# Patient Record
Sex: Female | Born: 1965 | Race: White | Hispanic: No | Marital: Married | Smoking: Never smoker
Health system: Southern US, Community
[De-identification: ages and names within clinical notes are randomized; demographics above are authoritative.]

## PROBLEM LIST (undated history)

## (undated) HISTORY — PX: NO PAST SURGERIES: SHX2092

---

## 1994-05-14 HISTORY — PX: DILATION AND CURETTAGE OF UTERUS: SHX78

## 2020-03-07 ENCOUNTER — Other Ambulatory Visit: Payer: Self-pay

## 2020-03-07 ENCOUNTER — Encounter: Payer: Self-pay | Admitting: Emergency Medicine

## 2020-03-07 ENCOUNTER — Ambulatory Visit
Admission: EM | Admit: 2020-03-07 | Discharge: 2020-03-07 | Disposition: A | Discharge status: Home / Self Care | Payer: BC Managed Care – PPO | Attending: Emergency Medicine | Admitting: Emergency Medicine

## 2020-03-07 DIAGNOSIS — R059 Cough, unspecified: Secondary | ICD-10-CM

## 2020-03-07 DIAGNOSIS — Z20822 Contact with and (suspected) exposure to covid-19: Secondary | ICD-10-CM | POA: Insufficient documentation

## 2020-03-07 DIAGNOSIS — J0110 Acute frontal sinusitis, unspecified: Secondary | ICD-10-CM | POA: Diagnosis not present

## 2020-03-07 MED ORDER — DOXYCYCLINE HYCLATE 100 MG PO CAPS: 100.0000 mg | ORAL_CAPSULE | Freq: Two times a day (BID) | ORAL | 0 refills | Status: AC

## 2020-03-07 NOTE — ED Provider Notes (Signed)
[facility]    CSN: [id number] Arrival date & time: 03/07/20  1022      History   Chief Complaint Chief Complaint  Patient presents with  . Cough    HPI Alison Patel is a 54 y.o. female.   54 year old female presents with nasal congestion, headache, post nasal drainage and cough for the past 4 days. Getting worse over the past day with more sinus pressure and facial and tooth pain. Denies any fever or GI symptoms. Has taken Sudafed and Dayquil with minimal relief. No known exposure to COVID 19. Has had the vaccine. Has history of recurrent sinus infections- usually has about 2 to 3 a year. Symptoms feel similar to previous infections. Otherwise no other chronic health issues. Takes no daily medications.   The history is provided by the patient.    History reviewed. No pertinent past medical history.  There are no problems to display for this patient.   Past Surgical History:  Procedure Laterality Date  . NO PAST SURGERIES      OB History   No obstetric history on file.      Home Medications    Prior to Admission medications   Medication Sig Start Date End Date Taking? Authorizing Provider  doxycycline (VIBRAMYCIN) 100 MG capsule Take 1 capsule (100 mg total) by mouth 2 (two) times daily for 7 days. 03/07/20 03/14/20  Sudie Grumbling, NP    Family History History reviewed. No pertinent family history.  Social History Social History   Tobacco Use  . Smoking status: Never Smoker  . Smokeless tobacco: Never Used  Vaping Use  . Vaping Use: Never used  Substance Use Topics  . Alcohol use: Yes  . Drug use: Not Currently     Allergies   Penicillins   Review of Systems Review of Systems  Constitutional: Positive for fatigue. Negative for appetite change, chills and fever.  HENT: Positive for congestion, postnasal drip, sinus pressure and sinus pain. Negative for ear discharge, ear pain, facial swelling, mouth sores, nosebleeds, sore throat  and trouble swallowing.   Eyes: Negative for pain, discharge, redness and itching.  Respiratory: Positive for cough. Negative for chest tightness, shortness of breath and wheezing.   Gastrointestinal: Negative for abdominal pain, diarrhea, nausea and vomiting.  Musculoskeletal: Negative for arthralgias, myalgias, neck pain and neck stiffness.  Skin: Negative for color change and rash.  Allergic/Immunologic: Positive for environmental allergies. Negative for food allergies and immunocompromised state.  Neurological: Positive for headaches. Negative for dizziness, seizures, syncope, weakness, light-headedness and numbness.  Hematological: Negative for adenopathy. Does not bruise/bleed easily.     Physical Exam Triage Vital Signs ED Triage Vitals  Enc Vitals Group     BP 03/07/20 1046 116/69     Pulse Rate 03/07/20 1046 79     Resp 03/07/20 1046 18     Temp 03/07/20 1046 98.3 F (36.8 C)     Temp Source 03/07/20 1046 Oral     SpO2 03/07/20 1046 99 %     Weight 03/07/20 1042 130 lb (59 kg)     Height 03/07/20 1042 5\' 6"  (1.676 m)     Head Circumference --      Peak Flow --      Pain Score 03/07/20 1042 2     Pain Loc --      Pain Edu? --      Excl. in GC? --    No data found.  Updated Vital Signs BP  116/69 (BP Location: Left Arm)   Pulse 79   Temp 98.3 F (36.8 C) (Oral)   Resp 18   Ht 5\' 6"  (1.676 m)   Wt 130 lb (59 kg)   SpO2 99%   BMI 20.98 kg/m   Visual Acuity Right Eye Distance:   Left Eye Distance:   Bilateral Distance:    Right Eye Near:   Left Eye Near:    Bilateral Near:     Physical Exam Vitals and nursing note reviewed.  Constitutional:      General: She is awake. She is not in acute distress.    Appearance: She is well-developed and well-groomed. She is ill-appearing.     Comments: She is sitting comfortably on the exam table in no acute distress but appears ill and tired.   HENT:     Head: Normocephalic and atraumatic.     Right Ear: Hearing,  tympanic membrane, ear canal and external ear normal.     Left Ear: Hearing, tympanic membrane, ear canal and external ear normal.     Nose: Congestion present.     Right Sinus: Maxillary sinus tenderness and frontal sinus tenderness present.     Left Sinus: Maxillary sinus tenderness and frontal sinus tenderness present.     Comments: More frontal sinus tenderness than maxillary.     Mouth/Throat:     Lips: Pink.     Mouth: Mucous membranes are moist.     Pharynx: Uvula midline. Posterior oropharyngeal erythema present. No pharyngeal swelling or uvula swelling. Oropharyngeal exudate: slight yellowish post nasal drainage present.  Eyes:     Extraocular Movements: Extraocular movements intact.     Conjunctiva/sclera: Conjunctivae normal.  Cardiovascular:     Rate and Rhythm: Normal rate and regular rhythm.     Heart sounds: Normal heart sounds. No murmur heard.   Pulmonary:     Effort: Pulmonary effort is normal. No respiratory distress.     Breath sounds: Normal breath sounds and air entry. No decreased air movement. No decreased breath sounds, wheezing, rhonchi or rales.  Musculoskeletal:     Cervical back: Normal range of motion and neck supple. No rigidity.  Lymphadenopathy:     Cervical: No cervical adenopathy.  Skin:    General: Skin is warm and dry.     Capillary Refill: Capillary refill takes less than 2 seconds.     Findings: No rash.  Neurological:     General: No focal deficit present.     Mental Status: She is alert and oriented to person, place, and time.  Psychiatric:        Mood and Affect: Mood normal.        Behavior: Behavior normal. Behavior is cooperative.        Thought Content: Thought content normal.        Judgment: Judgment normal.      UC Treatments / Results  Labs (all labs ordered are listed, but only abnormal results are displayed) Labs Reviewed  SARS CORONAVIRUS 2 (TAT 6-24 HRS)    EKG   Radiology No results  found.  Procedures Procedures (including critical care time)  Medications Ordered in UC Medications - No data to display  Initial Impression / Assessment and Plan / UC Course  I have reviewed the triage vital signs and the nursing notes.  Pertinent labs & imaging results that were available during my care of the patient were reviewed by me and considered in my medical decision making (see chart for details).  Reviewed with patient that she probably has an early sinus infection. Due to previous history of recurrent infections, will treat for possible bacterial infection with Doxycycline 100mg  twice a day as directed. Continue to increase fluids to help loosen up mucus in sinuses. May continue Sudafed and Dayquil/Nyquil as needed for drainage that is probably causing her cough or may take Benadryl at night for drainage and to help sleep. Follow-up pending COVID 19 test results and in 4 to 5 days if not improving.  Final Clinical Impressions(s) / UC Diagnoses   Final diagnoses:  Acute non-recurrent frontal sinusitis  Cough     Discharge Instructions     Recommend start Doxycycline 100mg  twice a day as directed. Continue to increase fluids to help loosen up mucus in sinuses. May continue Sudafed and Dayquil/Nyquil or Benadryl as needed for drainage and congestion. Follow-up pending COVID 19 test results and in 4 to 5 days if not improving.     ED Prescriptions    Medication Sig Dispense Auth. Provider   doxycycline (VIBRAMYCIN) 100 MG capsule Take 1 capsule (100 mg total) by mouth 2 (two) times daily for 7 days. 14 capsule Nicholle Falzon, , NP     PDMP not reviewed this encounter.   , NP 03/07/20 1910

## 2020-03-07 NOTE — ED Triage Notes (Signed)
Pt c/o headache, sinus pain/pressure, cough, post nasal drip, green sputum. Started about 3 -4 days ago. Denies fever.

## 2020-03-07 NOTE — Discharge Instructions (Addendum)
Recommend start Doxycycline 100mg  twice a day as directed. Continue to increase fluids to help loosen up mucus in sinuses. May continue Sudafed and Dayquil/Nyquil or Benadryl as needed for drainage and congestion. Follow-up pending COVID 19 test results and in 4 to 5 days if not improving.

## 2020-03-08 LAB — SARS CORONAVIRUS 2 (TAT 6-24 HRS): SARS Coronavirus 2: NEGATIVE

## 2020-08-15 ENCOUNTER — Other Ambulatory Visit: Payer: Self-pay | Admitting: Obstetrics & Gynecology

## 2020-08-15 DIAGNOSIS — Z1231 Encounter for screening mammogram for malignant neoplasm of breast: Secondary | ICD-10-CM

## 2020-08-22 ENCOUNTER — Ambulatory Visit
Admission: RE | Admit: 2020-08-22 | Discharge: 2020-08-22 | Disposition: A | Discharge status: Home / Self Care | Payer: BC Managed Care – PPO | Source: Ambulatory Visit | Attending: Obstetrics & Gynecology | Admitting: Obstetrics & Gynecology

## 2020-08-22 ENCOUNTER — Other Ambulatory Visit: Payer: Self-pay

## 2020-08-22 DIAGNOSIS — Z1231 Encounter for screening mammogram for malignant neoplasm of breast: Secondary | ICD-10-CM | POA: Diagnosis present

## 2020-10-30 ENCOUNTER — Ambulatory Visit
Admission: EM | Admit: 2020-10-30 | Discharge: 2020-10-30 | Disposition: A | Discharge status: Home / Self Care | Payer: BC Managed Care – PPO | Attending: Emergency Medicine | Admitting: Emergency Medicine

## 2020-10-30 ENCOUNTER — Other Ambulatory Visit: Payer: Self-pay

## 2020-10-30 ENCOUNTER — Encounter: Payer: Self-pay | Admitting: Emergency Medicine

## 2020-10-30 DIAGNOSIS — J069 Acute upper respiratory infection, unspecified: Secondary | ICD-10-CM | POA: Diagnosis present

## 2020-10-30 DIAGNOSIS — U071 COVID-19: Secondary | ICD-10-CM

## 2020-10-30 MED ORDER — METHYLPREDNISOLONE 4 MG PO TBPK: ORAL_TABLET | ORAL | 0 refills | Status: DC

## 2020-10-30 MED ORDER — AZITHROMYCIN 250 MG PO TABS: ORAL_TABLET | ORAL | 0 refills | Status: DC

## 2020-10-30 MED ORDER — NIRMATRELVIR/RITONAVIR (PAXLOVID)TABLET: 3.0000 | ORAL_TABLET | Freq: Two times a day (BID) | ORAL | 0 refills | Status: AC

## 2020-10-30 MED ORDER — BENZONATATE 100 MG PO CAPS: 100.0000 mg | ORAL_CAPSULE | Freq: Three times a day (TID) | ORAL | 0 refills | Status: DC

## 2020-10-30 NOTE — ED Triage Notes (Addendum)
Patient c/o cough, congestion, sinus pressure, headache, and chills that started 2 days ago. Patient took a rapid covid test and was negative.  Patient states that her husband tested positive for COVID on Monday.  Patient did not want a rapid flu test today.

## 2020-10-30 NOTE — ED Provider Notes (Signed)
[facility]    CSN: [id number] Arrival date & time: 10/30/20  1027      History   Chief Complaint Chief Complaint  Patient presents with   Cough   Nasal Congestion    HPI Alison Patel is a 55 y.o. female who presents today for several day history of headache, chills, cough, sore throat and facial congestion.  The patient does report sinus drainage.  The patient denies any hearing loss or vision loss.  The patient's husband tested positive for COVID last week and she has been around him.  The patient denies any significant production with her cough, when she does produce sputum she states is a yellow color.  She denies any chest pain or chest tightness.  Denies any loss of taste or smell.  The patient has been taking Tylenol as needed for comfort at this time.  The patient is currently febrile at today's visit.   Cough Associated symptoms: no chest pain    History reviewed. No pertinent past medical history.  There are no problems to display for this patient.   Past Surgical History:  Procedure Laterality Date   NO PAST SURGERIES      OB History   No obstetric history on file.      Home Medications    Prior to Admission medications   Medication Sig Start Date End Date Taking? Authorizing Provider  azithromycin (ZITHROMAX Z-PAK) 250 MG tablet Take per package instructions 10/30/20  Yes Anson Oregon, PA-C  benzonatate (TESSALON) 100 MG capsule Take 1 capsule (100 mg total) by mouth every 8 (eight) hours. 10/30/20  Yes Anson Oregon, PA-C  methylPREDNISolone (MEDROL DOSEPAK) 4 MG TBPK tablet Take per package instructions. 10/30/20  Yes Anson Oregon, PA-C  nirmatrelvir/ritonavir EUA (PAXLOVID) TABS Take 3 tablets by mouth 2 (two) times daily for 5 days. Patient GFR is 87. Take nirmatrelvir (150 mg) two tablets twice daily for 5 days and ritonavir (100 mg) one tablet twice daily for 5 days. 10/30/20 11/04/20 Yes Anson Oregon, PA-C     Family History Family History  Problem Relation Age of Onset   Breast cancer Mother 73   Breast cancer Maternal Aunt 55    Social History Social History   Tobacco Use   Smoking status: Never   Smokeless tobacco: Never  Vaping Use   Vaping Use: Never used  Substance Use Topics   Alcohol use: Yes   Drug use: Not Currently     Allergies   Penicillins   Review of Systems Review of Systems  HENT:  Positive for congestion, postnasal drip, sinus pressure and sinus pain.   Respiratory:  Positive for cough.   Cardiovascular:  Negative for chest pain.  Gastrointestinal:  Negative for nausea and vomiting.  All other systems reviewed and are negative.   Physical Exam Triage Vital Signs ED Triage Vitals  Enc Vitals Group     BP 10/30/20 1138 121/78     Pulse Rate 10/30/20 1138 (!) 113     Resp 10/30/20 1138 14     Temp 10/30/20 1138 (!) 100.5 F (38.1 C)     Temp Source 10/30/20 1138 Oral     SpO2 10/30/20 1138 99 %     Weight 10/30/20 1134 134 lb (60.8 kg)     Height 10/30/20 1134 5\' 6"  (1.676 m)     Head Circumference --      Peak Flow --      Pain Score 10/30/20 1134  8     Pain Loc --      Pain Edu? --      Excl. in GC? --    No data found.  Updated Vital Signs BP 121/78 (BP Location: Left Arm)   Pulse (!) 113   Temp (!) 100.5 F (38.1 C) (Oral)   Resp 14   Ht 5\' 6"  (1.676 m)   Wt 134 lb (60.8 kg)   SpO2 99%   BMI 21.63 kg/m   Visual Acuity Right Eye Distance:   Left Eye Distance:   Bilateral Distance:    Right Eye Near:   Left Eye Near:    Bilateral Near:     Physical Exam Constitutional:      Appearance: She is not ill-appearing, toxic-appearing or diaphoretic.  HENT:     Right Ear: Tympanic membrane and ear canal normal.     Left Ear: Tympanic membrane and ear canal normal.     Nose:     Comments: Mild erythema to bilateral nasal canals.    Mouth/Throat:     Mouth: Mucous membranes are dry.     Pharynx: Posterior oropharyngeal  erythema present. No oropharyngeal exudate.  Cardiovascular:     Rate and Rhythm: Regular rhythm. Tachycardia present.     Heart sounds: No murmur heard.   No gallop.  Pulmonary:     Breath sounds: Normal breath sounds. No stridor. No wheezing or rhonchi.  Abdominal:     General: Bowel sounds are normal.     Palpations: Abdomen is soft.  Lymphadenopathy:     Cervical: No cervical adenopathy.  Neurological:     Mental Status: She is alert.   UC Treatments / Results  Labs (all labs ordered are listed, but only abnormal results are displayed) Labs Reviewed  SARS CORONAVIRUS 2 (TAT 6-24 HRS)    EKG   Radiology No results found.  Procedures Procedures (including critical care time)  Medications Ordered in UC Medications - No data to display  Initial Impression / Assessment and Plan / UC Course  I have reviewed the triage vital signs and the nursing notes.  Pertinent labs & imaging results that were available during my care of the patient were reviewed by me and considered in my medical decision making (see chart for details).     1.  Treatment options were discussed today with the patient. 2.  The patient has positive COVID exposure and is currently febrile at today's visit, COVID test ordered, I do believe the patient will be positive for COVID. 3.  The patient was prescribed Paxlovid, she was instructed to not fill this until the positive COVID test is known. 4.  The patient was also given a prescription for azithromycin in addition to Medrol and Tessalon Perles, if she is negative for COVID I do believe that the patient is developing a bacterial sinusitis versus bronchitis. 5.  Remain in quarantine until COVID test results are known. 6.  Follow-up if symptoms fail to improve. Final Clinical Impressions(s) / UC Diagnoses   Final diagnoses:  Upper respiratory tract infection, unspecified type  COVID     Discharge Instructions      -Remain in isolation until COVID  results are known. -If COVID test is positive would recommend filling the Paxlovid prescription which is the antiviral medication. -If COVID test is negative, fill Zithromax and begin taking as prescribed. -Can go ahead and fill the prednisone to help with cough symptoms and Tessalon for cough symptoms as well. -Follow-up if  no improvement of symptoms.     ED Prescriptions     Medication Sig Dispense Auth. Provider   nirmatrelvir/ritonavir EUA (PAXLOVID) TABS Take 3 tablets by mouth 2 (two) times daily for 5 days. Patient GFR is 87. Take nirmatrelvir (150 mg) two tablets twice daily for 5 days and ritonavir (100 mg) one tablet twice daily for 5 days. 30 tablet Anson Oregon, PA-C   benzonatate (TESSALON) 100 MG capsule Take 1 capsule (100 mg total) by mouth every 8 (eight) hours. 21 capsule Anson Oregon, PA-C   methylPREDNISolone (MEDROL DOSEPAK) 4 MG TBPK tablet Take per package instructions. 21 tablet Anson Oregon, PA-C   azithromycin (ZITHROMAX Z-PAK) 250 MG tablet Take per package instructions 6 tablet Anson Oregon, PA-C      PDMP not reviewed this encounter.   Anson Oregon, PA-C 10/30/20 1740

## 2020-10-30 NOTE — Discharge Instructions (Addendum)
-  Remain in isolation until COVID results are known. -If COVID test is positive would recommend filling the Paxlovid prescription which is the antiviral medication. -If COVID test is negative, fill Zithromax and begin taking as prescribed. -Can go ahead and fill the prednisone to help with cough symptoms and Tessalon for cough symptoms as well. -Follow-up if no improvement of symptoms.

## 2020-10-31 LAB — SARS CORONAVIRUS 2 (TAT 6-24 HRS): SARS Coronavirus 2: POSITIVE — AB

## 2020-11-15 ENCOUNTER — Other Ambulatory Visit: Payer: Self-pay

## 2020-11-15 ENCOUNTER — Ambulatory Visit
Admission: EM | Admit: 2020-11-15 | Discharge: 2020-11-15 | Disposition: A | Discharge status: Home / Self Care | Payer: BC Managed Care – PPO | Attending: Family Medicine | Admitting: Family Medicine

## 2020-11-15 DIAGNOSIS — N3000 Acute cystitis without hematuria: Secondary | ICD-10-CM | POA: Insufficient documentation

## 2020-11-15 LAB — URINALYSIS, COMPLETE (UACMP) WITH MICROSCOPIC
Bilirubin Urine: NEGATIVE
Glucose, UA: NEGATIVE mg/dL
Ketones, ur: NEGATIVE mg/dL
Nitrite: POSITIVE — AB
Specific Gravity, Urine: 1.015 (ref 1.005–1.030)
WBC, UA: >50 WBC/hpf (ref 0–5)
pH: 7 (ref 5.0–8.0)

## 2020-11-15 MED ORDER — CEPHALEXIN 500 MG PO CAPS: 500.0000 mg | ORAL_CAPSULE | Freq: Two times a day (BID) | ORAL | 0 refills | Status: DC

## 2020-11-15 NOTE — ED Triage Notes (Signed)
Pt c/o cloudy urine, urinary urgency since Saturday.

## 2020-11-15 NOTE — ED Provider Notes (Signed)
[facility]    CSN: [id number] Arrival date & time: 11/15/20  0850      History   Chief Complaint Chief Complaint  Patient presents with   Urinary Frequency   HPI  55 year old female presents with urinary symptoms.  Started Saturday.  She reports cloudy urine, urinary frequency and urgency.  Denies hematuria.  Denies abdominal pain.  She has had some low back pain.  Unsure if this is contributing.  She states that she has some mild dysuria.  No relieving factors.  No other associated symptoms.  No other complaints.  Home Medications    Prior to Admission medications   Medication Sig Start Date End Date Taking? Authorizing Provider  cephALEXin (KEFLEX) 500 MG capsule Take 1 capsule (500 mg total) by mouth 2 (two) times daily. 11/15/20  Yes Nuvia Hileman, Verdis Frederickson, DO  azithromycin (ZITHROMAX Z-PAK) 250 MG tablet Take per package instructions 10/30/20   Anson Oregon, PA-C  benzonatate (TESSALON) 100 MG capsule Take 1 capsule (100 mg total) by mouth every 8 (eight) hours. 10/30/20   Anson Oregon, PA-C  methylPREDNISolone (MEDROL DOSEPAK) 4 MG TBPK tablet Take per package instructions. 10/30/20   Anson Oregon, PA-C    Family History Family History  Problem Relation Age of Onset   Breast cancer Mother 10   Breast cancer Maternal Aunt 42    Social History Social History   Tobacco Use   Smoking status: Never   Smokeless tobacco: Never  Vaping Use   Vaping Use: Never used  Substance Use Topics   Alcohol use: Yes   Drug use: Not Currently     Allergies   Penicillins   Review of Systems Review of Systems  Constitutional: Negative.   Gastrointestinal: Negative.   Genitourinary:  Positive for dysuria, frequency and urgency.    Physical Exam Triage Vital Signs ED Triage Vitals  Enc Vitals Group     BP 11/15/20 0923 111/63     Pulse Rate 11/15/20 0923 99     Resp 11/15/20 0923 16     Temp 11/15/20 0923 98.5 F (36.9 C)     Temp Source  11/15/20 0923 Oral     SpO2 11/15/20 0923 100 %     Weight 11/15/20 0922 134 lb (60.8 kg)     Height --      Head Circumference --      Peak Flow --      Pain Score 11/15/20 0922 0     Pain Loc --      Pain Edu? --      Excl. in GC? --    Updated Vital Signs BP 111/63 (BP Location: Left Arm)   Pulse 99   Temp 98.5 F (36.9 C) (Oral)   Resp 16   Wt 60.8 kg   SpO2 100%   BMI 21.63 kg/m   Visual Acuity Right Eye Distance:   Left Eye Distance:   Bilateral Distance:    Right Eye Near:   Left Eye Near:    Bilateral Near:     Physical Exam Vitals and nursing note reviewed.  Constitutional:      General: She is not in acute distress.    Appearance: Normal appearance. She is not ill-appearing.  HENT:     Head: Normocephalic and atraumatic.  Eyes:     General:        Right eye: No discharge.        Left eye: No discharge.  Conjunctiva/sclera: Conjunctivae normal.  Pulmonary:     Effort: Pulmonary effort is normal.     Breath sounds: Normal breath sounds. No wheezing, rhonchi or rales.  Abdominal:     General: There is no distension.     Palpations: Abdomen is soft.     Tenderness: There is no abdominal tenderness. There is no right CVA tenderness or left CVA tenderness.  Neurological:     Mental Status: She is alert.  Psychiatric:        Mood and Affect: Mood normal.        Behavior: Behavior normal.     UC Treatments / Results  Labs (all labs ordered are listed, but only abnormal results are displayed) Labs Reviewed  URINALYSIS, COMPLETE (UACMP) WITH MICROSCOPIC - Abnormal; Notable for the following components:      Result Value   Color, Urine STRAW (*)    APPearance CLOUDY (*)    Hgb urine dipstick SMALL (*)    Protein, ur TRACE (*)    Nitrite POSITIVE (*)    Leukocytes,Ua LARGE (*)    Bacteria, UA MANY (*)    All other components within normal limits  URINE CULTURE    EKG   Radiology No results found.  Procedures Procedures (including  critical care time)  Medications Ordered in UC Medications - No data to display  Initial Impression / Assessment and Plan / UC Course  I have reviewed the triage vital signs and the nursing notes.  Pertinent labs & imaging results that were available during my care of the patient were reviewed by me and considered in my medical decision making (see chart for details).    55 year old female presents with UTI.  Treating with Keflex. Awaiting culture results.  Final Clinical Impressions(s) / UC Diagnoses   Final diagnoses:  Acute cystitis without hematuria   Discharge Instructions   None    ED Prescriptions     Medication Sig Dispense Auth. Provider   cephALEXin (KEFLEX) 500 MG capsule Take 1 capsule (500 mg total) by mouth 2 (two) times daily. 14 capsule Everlene Other G, DO      PDMP not reviewed this encounter.   Tommie Sams, DO 11/15/20 1030

## 2020-11-18 LAB — URINE CULTURE: Culture: >=100000 — AB

## 2021-08-16 ENCOUNTER — Other Ambulatory Visit: Payer: Self-pay | Admitting: Certified Nurse Midwife

## 2021-08-16 DIAGNOSIS — Z1231 Encounter for screening mammogram for malignant neoplasm of breast: Secondary | ICD-10-CM

## 2021-08-31 ENCOUNTER — Ambulatory Visit
Admission: RE | Admit: 2021-08-31 | Discharge: 2021-08-31 | Disposition: A | Discharge status: Home / Self Care | Payer: BC Managed Care – PPO | Source: Ambulatory Visit | Attending: Certified Nurse Midwife | Admitting: Certified Nurse Midwife

## 2021-08-31 DIAGNOSIS — Z1231 Encounter for screening mammogram for malignant neoplasm of breast: Secondary | ICD-10-CM | POA: Diagnosis present

## 2021-10-06 ENCOUNTER — Ambulatory Visit
Admission: EM | Admit: 2021-10-06 | Discharge: 2021-10-06 | Disposition: A | Discharge status: Home / Self Care | Payer: BC Managed Care – PPO

## 2021-10-06 DIAGNOSIS — L509 Urticaria, unspecified: Secondary | ICD-10-CM

## 2021-10-06 NOTE — ED Triage Notes (Signed)
Pt c/o rash along thighs, feet, and knees x3days.  Pt states that the rash comes and goes and has changed locations over the last few days.  Pt states that her neighbor had [place] spotted fever and states that she had ticks on her recently.   Pt states that the rash itches.

## 2021-10-06 NOTE — Discharge Instructions (Addendum)
-  The rash that you have that comes and goes appears to look like hives.  This could be due to some sort of allergy or in contact with or as we discussed, hives could have been due to other things including stress, heat, cold, exercise or for seemingly no reason at all. - Reach out to your PCP about getting another opinion from an allergist and potentially more allergy testing. - Until then, continue antihistamines as needed. - Go to ER for any severe acute worsening of symptoms, especially if you were to ever have chest tightness or breathing difficulty or oral or facial swelling.

## 2021-10-06 NOTE — ED Provider Notes (Signed)
[facility]    CSN: [id number] Arrival date & time: 10/06/21  1522      History   Chief Complaint Chief Complaint  Patient presents with   Rash    HPI Kanyia Heaslip is a 56 y.o. female presenting for intermittent hives like rashes for the past 3 days.  She says that they will appear on her thighs, feet and knees.  She experienced some on her feet earlier today but says they have gone away now.  She did not take any medicine when she noticed them earlier.  Sometimes, she says she takes Benadryl and the rash goes away.  She takes Benadryl every night for chronic cough and postnasal drainage that is due to allergies.  Patient has had allergy testing (skin testing) and also seen a pulmonologist about chronic cough and postnasal drainage but did not have any positive testing.  However, the pulmonologist believes that she has allergy to some undiagnosed cause.  Patient reports she had been taking daily antihistamine in the morning also for the past week but stopped taking that a few days ago.  She does not currently have a rash.  She denies any associated swelling or breathing difficulty.  Patient has allergy to penicillin, otherwise no known allergies.  No new medications or different lotions, perfumes, body washes or detergents.  HPI  History reviewed. No pertinent past medical history.  There are no problems to display for this patient.   Past Surgical History:  Procedure Laterality Date   NO PAST SURGERIES      OB History   No obstetric history on file.      Home Medications    Prior to Admission medications   Not on File    Family History Family History  Problem Relation Age of Onset   Breast cancer Mother 76   Breast cancer Maternal Aunt 32    Social History Social History   Tobacco Use   Smoking status: Never   Smokeless tobacco: Never  Vaping Use   Vaping Use: Never used  Substance Use Topics   Alcohol use: Yes   Drug use: Not Currently      Allergies   Penicillins   Review of Systems Review of Systems  Constitutional:  Negative for fatigue and fever.  HENT:  Positive for postnasal drip (chronic). Negative for congestion, facial swelling and sore throat.   Respiratory:  Positive for cough (chronic). Negative for shortness of breath and wheezing.   Gastrointestinal:  Negative for vomiting.  Musculoskeletal:  Negative for arthralgias and joint swelling.  Skin:  Positive for rash.  Allergic/Immunologic: Negative for environmental allergies and food allergies.    Physical Exam Triage Vital Signs ED Triage Vitals  Enc Vitals Group     BP 10/06/21 1607 117/68     Pulse Rate 10/06/21 1607 85     Resp 10/06/21 1607 18     Temp 10/06/21 1607 98.3 F (36.8 C)     Temp Source 10/06/21 1607 Oral     SpO2 10/06/21 1607 97 %     Weight 10/06/21 1606 127 lb (57.6 kg)     Height 10/06/21 1606 5\' 6"  (1.676 m)     Head Circumference --      Peak Flow --      Pain Score 10/06/21 1605 0     Pain Loc --      Pain Edu? --      Excl. in GC? --    No data found.  Updated Vital Signs BP 117/68 (BP Location: Left Arm)   Pulse 85   Temp 98.3 F (36.8 C) (Oral)   Resp 18   Ht 5\' 6"  (1.676 m)   Wt 127 lb (57.6 kg)   SpO2 97%   BMI 20.50 kg/m      Physical Exam Vitals and nursing note reviewed.  Constitutional:      General: She is not in acute distress.    Appearance: Normal appearance. She is not ill-appearing or toxic-appearing.  HENT:     Head: Normocephalic and atraumatic.     Nose: Nose normal.     Mouth/Throat:     Mouth: Mucous membranes are moist.     Pharynx: Oropharynx is clear.  Eyes:     General: No scleral icterus.       Right eye: No discharge.        Left eye: No discharge.     Conjunctiva/sclera: Conjunctivae normal.  Cardiovascular:     Rate and Rhythm: Normal rate and regular rhythm.     Heart sounds: Normal heart sounds.  Pulmonary:     Effort: Pulmonary effort is normal. No respiratory  distress.     Breath sounds: Normal breath sounds.  Musculoskeletal:     Cervical back: Neck supple.  Skin:    General: Skin is dry.     Findings: No rash.  Neurological:     General: No focal deficit present.     Mental Status: She is alert. Mental status is at baseline.     Motor: No weakness.     Gait: Gait normal.  Psychiatric:        Mood and Affect: Mood normal.        Behavior: Behavior normal.        Thought Content: Thought content normal.     UC Treatments / Results  Labs (all labs ordered are listed, but only abnormal results are displayed) Labs Reviewed - No data to display  EKG   Radiology No results found.  Procedures Procedures (including critical care time)  Medications Ordered in UC Medications - No data to display  Initial Impression / Assessment and Plan / UC Course  I have reviewed the triage vital signs and the nursing notes.  Pertinent labs & imaging results that were available during my care of the patient were reviewed by me and considered in my medical decision making (see chart for details).  56 year old female presents for hives like rash that comes and goes over the past 3 days.  She did currently does not have a rash but she did show me a picture of hives like rash on her feet that she experienced earlier today.  Rash goes away with Benadryl or sometimes randomly on its own.  Patient reports being under a little bit more stress than normal since moving her [place] to [place].  She denies any associated breathing difficulty or facial swelling.  She does have chronic cough and postnasal drainage related to suspected allergy but has not been positive on any allergy test.  Vitals are stable and normal today.  She is overall well-appearing.  She currently does not have a rash and her exam is otherwise normal.  Advised patient her rash appears to be hives like and it could be completely random or due to heat, cold, stress, undiagnosed allergy.  Suggest  that she continue the antihistamines and try to identify potential trigger, otherwise follow-up with PCP and ask about getting allergy testing again since  its been several years since she was tested.  ED precautions reviewed.   Final Clinical Impressions(s) / UC Diagnoses   Final diagnoses:  Hives     Discharge Instructions      -The rash that you have that comes and goes appears to look like hives.  This could be due to some sort of allergy or in contact with or as we discussed, hives could have been due to other things including stress, heat, cold, exercise or for seemingly no reason at all. - Reach out to your PCP about getting another opinion from an allergist and potentially more allergy testing. - Until then, continue antihistamines as needed. - Go to ER for any severe acute worsening of symptoms, especially if you were to ever have chest tightness or breathing difficulty or oral or facial swelling.     ED Prescriptions   None    PDMP not reviewed this encounter.   Shirlee Latch, PA-C 10/06/21 1645

## 2021-11-11 ENCOUNTER — Ambulatory Visit
Admission: EM | Admit: 2021-11-11 | Discharge: 2021-11-11 | Disposition: A | Discharge status: Home / Self Care | Payer: BC Managed Care – PPO | Attending: Family Medicine | Admitting: Family Medicine

## 2021-11-11 ENCOUNTER — Encounter: Payer: Self-pay | Admitting: Emergency Medicine

## 2021-11-11 DIAGNOSIS — S50312A Abrasion of left elbow, initial encounter: Secondary | ICD-10-CM | POA: Diagnosis not present

## 2021-11-11 DIAGNOSIS — Z23 Encounter for immunization: Secondary | ICD-10-CM

## 2021-11-11 DIAGNOSIS — T148XXA Other injury of unspecified body region, initial encounter: Secondary | ICD-10-CM

## 2021-11-11 MED ORDER — TETANUS-DIPHTH-ACELL PERTUSSIS 5-2.5-18.5 LF-MCG/0.5 IM SUSY
0.5000 mL | PREFILLED_SYRINGE | Freq: Once | INTRAMUSCULAR | Status: AC
  Administered 2021-11-11: 0.5 mL via INTRAMUSCULAR

## 2021-11-11 NOTE — ED Triage Notes (Signed)
Patient states that she scratched her left upper arm on a possum cage about 1 hour ago.  Patient denies an animal bite.  Patient has no bleeding at this time.

## 2021-11-11 NOTE — ED Provider Notes (Signed)
[facility]    CSN: [id number] Arrival date & time: 11/11/21  1241      History   Chief Complaint Chief Complaint  Patient presents with   Abrasion    Left upper arm    HPI 56 year old female presents with an abrasion to her left elbow.  She states that she scratched her left arm on a possum cage approximately hour prior to arrival.  The area was initially bleeding but this has now stopped.  The area is very small.  No bite per her report.  Unsure last tetanus.  Needs tetanus today.  Denies any pain.  No other associated symptoms.  No other complaints.   Home Medications    Prior to Admission medications   Medication Sig Start Date End Date Taking? Authorizing Provider  fexofenadine (ALLEGRA) 60 MG tablet Take by mouth.    [provider]  levocetirizine (XYZAL) 5 MG tablet Take by mouth.    [provider]    Family History Family History  Problem Relation Age of Onset   Breast cancer Mother 60   Breast cancer Maternal Aunt 47    Social History Social History   Tobacco Use   Smoking status: Never   Smokeless tobacco: Never  Vaping Use   Vaping Use: Never used  Substance Use Topics   Alcohol use: Yes   Drug use: Not Currently     Allergies   Penicillins   Review of Systems Review of Systems Per HPI  Physical Exam Triage Vital Signs ED Triage Vitals  Enc Vitals Group     BP 11/11/21 1304 (!) 114/55     Pulse Rate 11/11/21 1304 73     Resp 11/11/21 1304 14     Temp 11/11/21 1304 98.2 F (36.8 C)     Temp Source 11/11/21 1304 Oral     SpO2 11/11/21 1304 96 %     Weight 11/11/21 1301 128 lb (58.1 kg)     Height 11/11/21 1301 5\' 6"  (1.676 m)     Head Circumference --      Peak Flow --      Pain Score 11/11/21 1301 0     Pain Loc --      Pain Edu? --      Excl. in GC? --    Updated Vital Signs BP (!) 114/55 (BP Location: Left Arm)   Pulse 73   Temp 98.2 F (36.8 C) (Oral)   Resp 14   Ht 5\' 6"  (1.676 m)   Wt  58.1 kg   SpO2 96%   BMI 20.66 kg/m   Visual Acuity Right Eye Distance:   Left Eye Distance:   Bilateral Distance:    Right Eye Near:   Left Eye Near:    Bilateral Near:     Physical Exam Constitutional:      General: She is not in acute distress.    Appearance: Normal appearance.  HENT:     Head: Normocephalic and atraumatic.  Pulmonary:     Effort: Pulmonary effort is normal. No respiratory distress.  Skin:    Comments: Small abrasion to the left elbow.  Neurological:     Mental Status: She is alert.  Psychiatric:        Mood and Affect: Mood normal.        Behavior: Behavior normal.    UC Treatments / Results  Labs (all labs ordered are listed, but only abnormal results are displayed) Labs Reviewed - No data  to display  EKG   Radiology No results found.  Procedures Procedures (including critical care time)  Medications Ordered in UC Medications  Tdap (BOOSTRIX) injection 0.5 mL (0.5 mLs Intramuscular Given 11/11/21 1306)    Initial Impression / Assessment and Plan / UC Course  I have reviewed the triage vital signs and the nursing notes.  Pertinent labs & imaging results that were available during my care of the patient were reviewed by me and considered in my medical decision making (see chart for details).    56 year old female presents with an abrasion.  Advised to keep the area clean.  Supportive care.  Tetanus given today.  Final Clinical Impressions(s) / UC Diagnoses   Final diagnoses:  Abrasion   Discharge Instructions   None    ED Prescriptions   None    PDMP not reviewed this encounter.   Tommie Sams, DO 11/11/21 1316

## 2022-01-19 LAB — LAB REPORT - SCANNED: EGFR: 103

## 2022-08-13 NOTE — Progress Notes (Signed)
Office Visit Note  Patient: Alison Patel             Date of Birth: 1965-11-10           MRN: [id number]             PCP: Patient, No Pcp Per Referring: Ronne Binning, NP Visit Date: 08/24/2022 Occupation: @  Subjective:  Chronic cough, hives and positive ANA  History of Present Illness: Alison Patel is a 57 y.o. female seen in consultation per request of her PCP for the evaluation of positive ANA.  According the patient while she was living in [place] about 20 years ago she started having chronic cough.  She was evaluated by pulmonologist who felt that her symptoms were related to allergies.  She was also evaluated by an ENT doctor who thought her symptoms were related to reflux.  She had a chest x-ray and a CT scan.  Patient recalls that she was told there was some scarring in there but no further workup was needed.  She states her symptoms persist but no further interventions were done.  She moved from [place] to [place] in 2021.  She states she went for a hike in the fall to [place] and developed hives all over her body.  She noticed that after hiking and exercise she will develop hives.  She was evaluated by Dr. Cheree Ditto, dermatologist who advised antihistamines.  She started taking Allegra in the morning and Xyzal in the evening.  She states her hives got worse when she moved her daughter in May 2023 from [place] to [place].  She went to see her PCP in August 2023 who did labs and her ANA was positive but no titer was given.  She has noticed hives after playing tennis.  She was evaluated by dermatologist again and had additional additional labs which were negative.  She also had a recent chest x-ray last year which was within normal limits.  She denies any history of oral ulcers, nasal ulcers, malar rash, photosensitivity, Raynaud's, lymphadenopathy or inflammatory arthritis.  She has had problems with bilateral trochanteric bursitis over the years.  She has pain when she  sleeps on the side.  There is no family history of autoimmune disease.  She is gravida 3, para 2, miscarriages 1.  There is no history of DVTs.    Activities of Daily Living:  Patient reports morning stiffness for 5 minutes.   Patient Denies nocturnal pain.  Difficulty dressing/grooming: Denies Difficulty climbing stairs: Denies Difficulty getting out of chair: Denies Difficulty using hands for taps, buttons, cutlery, and/or writing: Denies  Review of Systems  Constitutional:  Negative for fatigue.  HENT:  Negative for mouth sores and mouth dryness.   Eyes:  Negative for dryness.  Respiratory:  Positive for cough. Negative for shortness of breath.   Cardiovascular:  Negative for chest pain and palpitations.  Gastrointestinal:  Negative for blood in stool, constipation and diarrhea.  Endocrine: Negative for increased urination.  Genitourinary:  Negative for involuntary urination.  Musculoskeletal:  Positive for morning stiffness. Negative for joint pain, gait problem, joint pain, joint swelling, myalgias, muscle weakness, muscle tenderness and myalgias.  Skin:  Positive for rash. Negative for color change, hair loss and sensitivity to sunlight.  Allergic/Immunologic: Negative for susceptible to infections.  Neurological:  Positive for headaches. Negative for dizziness.  Hematological:  Negative for swollen glands.  Psychiatric/Behavioral:  Negative for depressed mood and sleep disturbance. The patient is nervous/anxious.  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 No pertinent past medical history.  Family History  Problem Relation Age of Onset   Breast cancer Mother 36   Diabetes Father    Prostate cancer Father    Breast cancer Maternal Aunt 49   Colon cancer Maternal  Uncle    Past Surgical History:  Procedure Laterality Date   NO PAST SURGERIES     Social History   Social History Narrative   Not on file   Immunization History  Administered Date(s) Administered   Tdap 11/11/2021     Objective: Vital Signs: BP 127/74 (BP Location: Right Arm, Patient Position: Sitting, Cuff Size: Normal)   Pulse 98   Ht 5' 6.5" (1.689 m)   Wt 130 lb (59 kg)   BMI 20.67 kg/m    Physical Exam Vitals and nursing note reviewed.  Constitutional:      Appearance: She is well-developed.  HENT:     Head: Normocephalic and atraumatic.  Eyes:     Conjunctiva/sclera: Conjunctivae normal.  Cardiovascular:     Rate and Rhythm: Normal rate and regular rhythm.     Heart sounds: Normal heart sounds.  Pulmonary:     Effort: Pulmonary effort is normal.     Breath sounds: Normal breath sounds.  Abdominal:     General: Bowel sounds are normal.     Palpations: Abdomen is soft.  Musculoskeletal:     Cervical back: Normal range of motion.  Lymphadenopathy:     Cervical: No cervical adenopathy.  Skin:    General: Skin is warm and dry.     Capillary Refill: Capillary refill takes less than 2 seconds.  Neurological:     Mental Status: She is alert and oriented to person, place, and time.  Psychiatric:        Behavior: Behavior normal.      Musculoskeletal Exam: Cervical, thoracic and lumbar spine were in good range of motion.  There was no SI joint tenderness.  Shoulder joints, elbow joints, wrist joints, MCPs PIPs and DIPs were in good range of motion with no synovitis.  Hip joints, knee joints, ankles, MTPs and PIPs been good range of motion with no synovitis.  No rash was noted on the examination.  She had no difficulty getting up from the squatting position.  CDAI Exam: CDAI Score: -- Patient Global: --; Provider Global: -- Swollen: --; Tender: -- Joint Exam 08/24/2022   No joint exam has been documented for this visit   There is currently no information  documented on the homunculus. Go to the Rheumatology activity and complete the homunculus joint exam.  Investigation: No additional findings.  Imaging: No results found.  Recent Labs: No results found for: "WBC", "HGB", "PLT", "NA", "K", "CL", "CO2", "GLUCOSE", "BUN", "CREATININE", "BILITOT", "ALKPHOS", "AST", "ALT", "PROT", "ALBUMIN", "CALCIUM", "GFRAA", "QFTBGOLD", "QFTBGOLDPLUS"   Speciality Comments: No specialty comments available.  Procedures:  No procedures performed Allergies: Penicillins   Assessment / Plan:     Visit Diagnoses: Positive ANA (antinuclear antibody) -patient has been struggling from chronic cough for the last 20 years and recurrent hives since 2021.  The hives started after she moved to [place] from [place].  She states the hives are related to workout or change in temperature.  She has been taking  antihistamines which helped to some extent.  During her workup her ANA was positive but no titer was given.  Significance of positive ANA was discussed.  Low titer is given.  Prevalence of positive ANA in the normal population was discussed.  I will obtain additional labs today.  There is no history of oral ulcers, nasal ulcers, malar rash, photosensitivity, Raynaud's or lymphadenopathy.  No synovitis was noted on the examination.  No sclerodactyly, nailbed capillary changes were noted.  She had good capillary refill.  Plan: Protein / creatinine ratio, urine, ANA, Anti-scleroderma antibody, RNP Antibody, Anti-Smith antibody, Sjogrens syndrome-A extractable nuclear antibody, Sjogrens syndrome-B extractable nuclear antibody, Anti-DNA antibody, double-stranded, C3 and C4  January 19, 2022 CBC normal, CMP normal, LDL 172, B12 normal, folic acid normal, vitamin Y80.1, ANA positive  February 20, 2022 anticardiolipin negative, beta-2 GP 1 negative, lupus anticoagulant negative,  Urticaria-she gives history of urticaria since 2021.  The urticaria is related to exercise,  exposure to heat or cold.  She has been taking Allegra in the morning and Xyzal in the evening which has been controlling her symptoms.  Chronic cough-she states cough started 20 years ago while she was living in [place].  She had extensive workup by pulmonologist, allergist and ENT which was inconclusive.  She states she had chest x-ray and CT scan and there was questionable scarring.  She continues to have chronic cough.  She has to take Benadryl at night because of the chronic cough.  I will refer her to pulmonology for evaluation.  She had a recent chest x-ray at [facility] on January 23, 2022 which was unremarkable.  Gilbert's syndrome  Mixed hyperlipidemia-LDL was elevated at 172.  She is not taking any medications.  Hx of adenomatous colonic polyps  Anxiety-she takes Zoloft 50 mg p.o. daily.  Orders: Orders Placed This Encounter  Procedures   Protein / creatinine ratio, urine   ANA   Anti-scleroderma antibody   RNP Antibody   Anti-Smith antibody   Sjogrens syndrome-A extractable nuclear antibody   Sjogrens syndrome-B extractable nuclear antibody   Anti-DNA antibody, double-stranded   C3 and C4   Ambulatory referral to Pulmonology   No orders of the defined types were placed in this encounter.    Follow-Up Instructions: Return for Positive ANA.   Pollyann Savoy, MD  Note - This record has been created using Animal nutritionist.  Chart creation errors have been sought, but may not always  have been located. Such creation errors do not reflect on  the standard of medical care.

## 2022-08-22 ENCOUNTER — Other Ambulatory Visit: Payer: Self-pay | Admitting: Certified Nurse Midwife

## 2022-08-22 DIAGNOSIS — Z1231 Encounter for screening mammogram for malignant neoplasm of breast: Secondary | ICD-10-CM

## 2022-08-24 ENCOUNTER — Encounter: Payer: Self-pay | Admitting: Rheumatology

## 2022-08-24 ENCOUNTER — Ambulatory Visit: Payer: 59 | Attending: Rheumatology | Admitting: Rheumatology

## 2022-08-24 VITALS — BP 127/74 | HR 98 | Ht 66.5 in | Wt 130.0 lb

## 2022-08-24 DIAGNOSIS — L509 Urticaria, unspecified: Secondary | ICD-10-CM | POA: Insufficient documentation

## 2022-08-24 DIAGNOSIS — R053 Chronic cough: Secondary | ICD-10-CM | POA: Diagnosis not present

## 2022-08-24 DIAGNOSIS — F419 Anxiety disorder, unspecified: Secondary | ICD-10-CM | POA: Insufficient documentation

## 2022-08-24 DIAGNOSIS — Z86010 Personal history of colonic polyps: Secondary | ICD-10-CM | POA: Insufficient documentation

## 2022-08-24 DIAGNOSIS — E782 Mixed hyperlipidemia: Secondary | ICD-10-CM | POA: Insufficient documentation

## 2022-08-24 DIAGNOSIS — R768 Other specified abnormal immunological findings in serum: Secondary | ICD-10-CM

## 2022-08-24 DIAGNOSIS — Z860101 Personal history of adenomatous and serrated colon polyps: Secondary | ICD-10-CM

## 2022-08-26 LAB — ANTI-SMITH ANTIBODY: ENA SM Ab Ser-aCnc: <1 AI

## 2022-08-26 LAB — ANTI-SCLERODERMA ANTIBODY: Scleroderma (Scl-70) (ENA) Antibody, IgG: <1 AI

## 2022-08-26 LAB — SJOGRENS SYNDROME-A EXTRACTABLE NUCLEAR ANTIBODY: SSA (Ro) (ENA) Antibody, IgG: <1 AI

## 2022-08-26 LAB — PROTEIN / CREATININE RATIO, URINE
Creatinine, Urine: 74 mg/dL (ref 20–275)
Protein/Creat Ratio: 68 mg/g creat (ref 24–184)
Protein/Creatinine Ratio: 0.068 mg/mg creat (ref 0.024–0.184)
Total Protein, Urine: 5 mg/dL (ref 5–24)

## 2022-08-26 LAB — C3 AND C4
C3 Complement: 97 mg/dL (ref 83–193)
C4 Complement: 19 mg/dL (ref 15–57)

## 2022-08-26 LAB — ANTI-DNA ANTIBODY, DOUBLE-STRANDED: ds DNA Ab: 2 IU/mL

## 2022-08-26 LAB — ANA: Anti Nuclear Antibody (ANA): NEGATIVE

## 2022-08-26 LAB — SJOGRENS SYNDROME-B EXTRACTABLE NUCLEAR ANTIBODY: SSB (La) (ENA) Antibody, IgG: <1 AI

## 2022-08-26 LAB — RNP ANTIBODY: Ribonucleic Protein(ENA) Antibody, IgG: 1.7 AI — AB

## 2022-08-26 NOTE — Progress Notes (Signed)
RNP is positive.  All other labs were negative.  I will discuss results at the follow-up visit.

## 2022-08-31 NOTE — Progress Notes (Addendum)
 Office Visit Note  Patient: Alison Patel             Date of Birth: 1965/06/10           MRN: [id number]             PCP: Patient, No Pcp Per Referring: Arne Bevel, NP Visit Date: 09/14/2022 Occupation: @GUAROCC @  Subjective:  Abnormal labs  History of Present Illness: Alison Patel is a 57 y.o. female with history of positive ANA, chronic cough for 20 years and recurrent hives for the last 3 years.  She had improvement in the hives after taking antihistamines.  There is no history of oral ulcers, nasal ulcers, malar rash, photosensitivity, Raynaud's or lymphadenopathy.  No history of inflammatory arthritis.  She had seen pulmonologist, allergist and ENT for chronic cough for many years.  She was evaluated by Dr. 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  He gave her a prescription for Symbicort .    Activities of Daily Living:  Patient reports morning stiffness for a few minutes.   Patient Denies nocturnal pain.  Difficulty dressing/grooming: Denies Difficulty climbing stairs: Denies Difficulty getting out of chair: Denies Difficulty using hands for taps, buttons, cutlery, and/or writing: Denies  Review of Systems  Constitutional:  Negative for fatigue.  HENT:  Negative for mouth sores and mouth dryness.   Eyes:  Negative for dryness.  Respiratory:  Negative for shortness of breath.   Cardiovascular:  Negative for chest pain and palpitations.  Gastrointestinal:  Positive for diarrhea. Negative for blood in stool and constipation.  Endocrine: Negative for increased urination.  Genitourinary:  Negative for involuntary urination.  Musculoskeletal:  Positive for morning stiffness. Negative for joint pain, gait problem, joint pain, joint swelling, myalgias, muscle weakness, muscle tenderness and myalgias.  Skin:  Positive for rash. Negative for color change, hair loss and sensitivity  to sunlight.  Allergic/Immunologic: Negative for susceptible to infections.  Neurological:  Negative for dizziness and headaches.  Hematological:  Negative for swollen glands.  Psychiatric/Behavioral:  Negative for depressed mood and sleep disturbance. The patient is nervous/anxious.     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 No pertinent past medical history.  Family History  Problem Relation Age of Onset   Breast cancer Mother 4   Diabetes Father    Prostate cancer Father    Healthy Sister    Healthy Brother    Breast cancer Maternal Aunt 60   Colon cancer Maternal Uncle    Healthy Son    Healthy Daughter    Past Surgical History:  Procedure Laterality Date   DILATION AND CURETTAGE OF UTERUS  1996   NO PAST SURGERIES     Social History   Social History Narrative   Not on file   Immunization History  Administered Date(s) Administered   Tdap 11/11/2021     Objective: Vital Signs: BP 124/79 (BP Location: Left Arm, Patient Position: Sitting, Cuff Size: Normal)   Pulse (!) 102   Resp 13   Ht 5' 6.5" (1.689 m)   Wt 129 lb (58.5 kg)   BMI 20.51 kg/m    Physical Exam Vitals and nursing note reviewed.  Constitutional:  Appearance: She is well-developed.  HENT:     Head: Normocephalic and atraumatic.  Eyes:     Conjunctiva/sclera: Conjunctivae normal.  Cardiovascular:     Rate and Rhythm: Normal rate and regular rhythm.     Heart sounds: Normal heart sounds.  Pulmonary:     Effort: Pulmonary effort is normal.     Breath sounds: Normal breath sounds.  Abdominal:     General: Bowel sounds are normal.     Palpations: Abdomen is soft.  Musculoskeletal:     Cervical back: Normal range of motion.  Lymphadenopathy:     Cervical: No cervical adenopathy.   Skin:    General: Skin is warm and dry.     Capillary Refill: Capillary refill takes less than 2 seconds.     Comments: No sclerodactyly, nailbed capillary changes or telangiectasia were noted.  Neurological:     Mental Status: She is alert and oriented to person, place, and time.  Psychiatric:        Behavior: Behavior normal.      Musculoskeletal Exam: Cervical, thoracic and lumbar spine were in good range of motion.  Shoulder joints, elbow joints, wrist joints, MCPs PIPs and DIPs been good range of motion with no synovitis.  Hip joints, knee joints, ankles, MTPs and PIPs been good range of motion with no synovitis.  CDAI Exam: CDAI Score: -- Patient Global: --; Provider Global: -- Swollen: --; Tender: -- Joint Exam 09/14/2022   No joint exam has been documented for this visit   There is currently no information documented on the homunculus. Go to the Rheumatology activity and complete the homunculus joint exam.  Investigation: No additional findings.  Imaging: No results found.  Recent Labs: No results found for: "WBC", "HGB", "PLT", "NA", "K", "CL", "CO2", "GLUCOSE", "BUN", "CREATININE", "BILITOT", "ALKPHOS", "AST", "ALT", "PROT", "ALBUMIN", "CALCIUM", "GFRAA", "QFTBGOLD", "QFTBGOLDPLUS"  August 24, 2022 urine protein creatinine ratio is normal, ANA negative, RNP 1.7, SCL 70 negative, Smith negative, SSA negative, SSB negative, dsDNA negative  Speciality Comments: No specialty comments available.  Procedures:  No procedures performed Allergies: Penicillins   Assessment / Plan:     Visit Diagnoses: Positive RNP antibody -I had a detailed discussion with the patient and her husband regarding the labs obtained at the last visit.  RNP is positive which is nonspecific.  She does not have any clinical features of autoimmune disease.  She denies any history of oral ulcers, nasal ulcers, malar rash, photosensitivity, Raynaud's, inflammatory arthritis or lymphadenopathy.  Advised  her to contact me if she develops any new symptoms.  I will recheck labs in a year.  Plan: ANA, RNP Antibody  Urticaria-she has been experiencing urticaria for the last 3 years.  Her symptoms improved on antihistamines.  Chronic cough-she has seen ENT, pulmonologist and allergist in the past.  She was seen by Dr. Marygrace Snellen yesterday.  He mentioned in his note that the patient's symptoms were consistent with asthma variant.  She was given a prescription for Pulmicort  inhaler.  She had CT scan in the past which was unremarkable per patient.  She will follow-up with Dr.Hunsucker.  Other medical problems are listed as follows:  Mixed hyperlipidemia  Gilbert's syndrome  Hx of adenomatous colonic polyps  Anxiety  Orders: Orders Placed This Encounter  Procedures   ANA   RNP Antibody   No orders of the defined types were placed in this encounter.    Follow-Up Instructions: Return in about 1 year (around 09/14/2023) for +RNP.  Nicholas Bari, MD  Note - This record has been created using Animal nutritionist.  Chart creation errors have been sought, but may not always  have been located. Such creation errors do not reflect on  the standard of medical care.

## 2022-09-13 ENCOUNTER — Ambulatory Visit (INDEPENDENT_AMBULATORY_CARE_PROVIDER_SITE_OTHER): Payer: 59 | Admitting: Pulmonary Disease

## 2022-09-13 ENCOUNTER — Encounter: Payer: Self-pay | Admitting: Pulmonary Disease

## 2022-09-13 VITALS — BP 126/70 | HR 86 | Ht 66.5 in | Wt 129.0 lb

## 2022-09-13 DIAGNOSIS — R053 Chronic cough: Secondary | ICD-10-CM | POA: Diagnosis not present

## 2022-09-13 MED ORDER — BUDESONIDE-FORMOTEROL FUMARATE 160-4.5 MCG/ACT IN AERO: 2.0000 | INHALATION_SPRAY | Freq: Two times a day (BID) | RESPIRATORY_TRACT | 3 refills | Status: AC

## 2022-09-13 NOTE — Patient Instructions (Signed)
Nice to meet you  It is hard to tell the exact cause of your cough.  Since antihistamines and nasal sprays have not been particularly effective in the past we will avoid these for now but we may need to use them in the future.  Certainly silent reflux is a possibility but given the side effects experienced with the acid pills in the past, I would like to avoid them for as long as we can.  Since we have not tried inhalers I think it is reasonable to try an inhaler to treat cough, essentially THIS  is called a cough variant of asthma.  Use Symbicort 2 puffs in the morning and 2 puffs at night or evening.  Do this every day.  Rinse her mouth out with water and spit after every use.  Try to avoid lozenges or cough drops that contain menthol or mint.  Sugar-free Archie Balboa ranchers are a good alternative to use as a cough drop or cough suppressant.  I reviewed the chest x-ray images, the report indicates everything is clear as have the other reports history provided today.  We may need to consider a CT chest in the future to look for an uncommon cause of cough or chronic cough called bronchiectasis.  Use the new inhaler for 1 month at least.  If you find it no significant improvement let me know and we can reexplore antihistamines and nasal sprays before your next visit.  Return to clinic in 3 months or sooner as needed with Dr. Judeth Horn

## 2022-09-13 NOTE — Progress Notes (Signed)
@Patient  ID: Alison Patel, female    DOB: 07-Apr-1966, 57 y.o.   MRN: [id number]  Chief Complaint  Patient presents with   Consult    Referred by Dr. Corliss Skains for chronic cough for the past 20 years. States the cough has been productive at times with clear, thick phlegm. Denies any increased SOB or wheezing with the cough.     Referring provider: Pollyann Savoy, MD  HPI:   57 y.o. woman whom we are seeing for evaluation of chronic cough.  Note from referring provider x 2 reviewed.   Patient reports 20+ year history of cough.  Started 2030.  This been evaluated throughout the last 2 decades by various providers.  At one point seen by pulmonary.  Reports CT chest in the past clear.  Has had serial chest x-ray throughout the years.  All reportedly clear.  She presents several at least 3-4 chest x-ray reports from radiology that demonstrate the same.  Her pulmonologist put her on antihistamine and nasal spray.  Things seem to get better for a period of time but then she was lost to follow-up.  Overall she is not felt that this was particularly beneficial for any durable response.  She was seen by ENT in the past.  No fiberoptic exam per her report.  She was diagnosed with reflux.  Given a PPI.  Took for about a week.  Caused by a stomach pain.  Sounds like she has had some test to evaluate for reflux including contrast and a tilt table, fluoroscopy.  She was told she did not have reflux based on this.  Her PCP coordinated this.  She never used an inhaler.  All this care was done in [place].  Her cough can be productive or dry.  Reliably in the morning.  Not worse at night when she lies down.  Denies reflux or GERD symptoms.  Occasional postnasal drip but not routinely.  Nothing seems to really help the cough.  Uses lozenges frequently.  These often include mint.  Current lozenges, lemon mint.  Chest x-ray 01/2022 personally read interpreted hyperinflation with flattened diaphragms on the PA  view as well as increased lucency between sternum and mediastinal structures on the lateral view.  Questionaires / Pulmonary Flowsheets:   ACT:      No data to display          MMRC:     No data to display          Epworth:      No data to display          Tests:   FENO:  No results found for: "NITRICOXIDE"  PFT:     No data to display          WALK:      No data to display          Imaging: No results found.  Lab Results:  CBC No results found for: "WBC", "RBC", "HGB", "HCT", "PLT", "MCV", "MCH", "MCHC", "RDW", "LYMPHSABS", "MONOABS", "EOSABS", "BASOSABS"  BMET No results found for: "NA", "K", "CL", "CO2", "GLUCOSE", "BUN", "CREATININE", "CALCIUM", "GFRNONAA", "GFRAA"  BNP No results found for: "BNP"  ProBNP No results found for: "PROBNP"  Specialty Problems   None   Allergies  Allergen Reactions   Penicillins Rash    Immunization History  Administered Date(s) Administered   Tdap 11/11/2021    History reviewed. No pertinent past medical history.  Tobacco History: Social History   Tobacco Use  Smoking Status  Never  Smokeless Tobacco Never   Counseling given: Not Answered   Continue to not smoke  Outpatient Encounter Medications as of 09/13/2022  Medication Sig   budesonide-formoterol (SYMBICORT) 160-4.5 MCG/ACT inhaler Inhale 2 puffs into the lungs 2 (two) times daily.   Cholecalciferol (VITAMIN D3) 10 MCG (400 UNIT) tablet Take 800 Units by mouth daily.   fexofenadine (ALLEGRA) 60 MG tablet Take by mouth.   levocetirizine (XYZAL) 5 MG tablet Take by mouth.   Multiple Vitamin (MULTI-VITAMIN) tablet Take 1 tablet by mouth daily.   PROBIOTIC, LACTOBACILLUS, PO Take by mouth daily.   sertraline (ZOLOFT) 50 MG tablet Take by mouth.   [DISCONTINUED] diazepam (VALIUM) 5 MG tablet Take by mouth.   No facility-administered encounter medications on file as of 09/13/2022.     Review of Systems  Review of Systems  No  chest pain with exertion.  No orthopnea or PND.  Comprehensive review of systems otherwise negative. Physical Exam  BP 126/70   Pulse 86   Ht 5' 6.5" (1.689 m)   Wt 129 lb (58.5 kg)   SpO2 100% Comment: on RA  BMI 20.51 kg/m   Wt Readings from Last 5 Encounters:  09/13/22 129 lb (58.5 kg)  08/24/22 130 lb (59 kg)  11/11/21 128 lb (58.1 kg)  10/06/21 127 lb (57.6 kg)  11/15/20 134 lb (60.8 kg)    BMI Readings from Last 5 Encounters:  09/13/22 20.51 kg/m  08/24/22 20.67 kg/m  11/11/21 20.66 kg/m  10/06/21 20.50 kg/m  11/15/20 21.63 kg/m     Physical Exam General: No distress Eyes: EOMI, no icterus Neck: Supple, no JVP Pulmonary: Clear, normal work of breathing Cardiovascular: Borderline tachycardic, regular rhythm, no murmur Abdomen: Nondistended, bowel sounds present MSK: No synovitis, no joint effusion Neuro: Normal gait, no weakness Psych: Normal mood, full affect   Assessment & Plan:   Chronic cough: Unclear etiology.  Present for about 20 years.  Will be difficult to improve I believe.  No improvement with PPI therapy in the past and had side effect of stomach pain.  No significant improvement with antihistamines nasal steroids etc. in the past.  No clear description of chronic postnasal drip with adjusting to be some occasional issues with this.  Chest x-ray 01/2022 personally reviewed and demonstrates hyperinflation both on the PA and lateral view.  Fairly high suspicion for cough variant asthma.  Especially in light of imaging findings.  Symbicort high-dose 2 puff twice daily.  Assess response.  Consider cross-sectional imaging in future to evaluate for bronchiectasis etc.  Given possible upper airway cough syndrome, advised to avoid menthol mint containing lozenges.  Recommend sugar-free hard candies for cough suppression as needed.   Return in about 3 months (around 12/14/2022) for f/u Dr. Judeth Horn.   Karren Burly, MD 09/13/2022   This appointment  required 61 minutes of patient care (this includes precharting, chart review, review of results, face-to-face care, etc.).

## 2022-09-14 ENCOUNTER — Ambulatory Visit: Payer: 59 | Attending: Rheumatology | Admitting: Rheumatology

## 2022-09-14 ENCOUNTER — Encounter: Payer: Self-pay | Admitting: Rheumatology

## 2022-09-14 VITALS — BP 124/79 | HR 102 | Resp 13 | Ht 66.5 in | Wt 129.0 lb

## 2022-09-14 DIAGNOSIS — F419 Anxiety disorder, unspecified: Secondary | ICD-10-CM

## 2022-09-14 DIAGNOSIS — R768 Other specified abnormal immunological findings in serum: Secondary | ICD-10-CM | POA: Diagnosis not present

## 2022-09-14 DIAGNOSIS — L509 Urticaria, unspecified: Secondary | ICD-10-CM

## 2022-09-14 DIAGNOSIS — Z86010 Personal history of colonic polyps: Secondary | ICD-10-CM

## 2022-09-14 DIAGNOSIS — E782 Mixed hyperlipidemia: Secondary | ICD-10-CM | POA: Diagnosis not present

## 2022-09-14 DIAGNOSIS — R053 Chronic cough: Secondary | ICD-10-CM

## 2022-09-18 ENCOUNTER — Ambulatory Visit
Admission: RE | Admit: 2022-09-18 | Discharge: 2022-09-18 | Disposition: A | Discharge status: Home / Self Care | Payer: 59 | Source: Ambulatory Visit | Attending: Certified Nurse Midwife | Admitting: Certified Nurse Midwife

## 2022-09-18 DIAGNOSIS — Z1231 Encounter for screening mammogram for malignant neoplasm of breast: Secondary | ICD-10-CM | POA: Diagnosis not present

## 2022-09-20 ENCOUNTER — Telehealth: Payer: Self-pay | Admitting: Pulmonary Disease

## 2022-09-20 NOTE — Telephone Encounter (Signed)
Patient states needs generic Symbicort. Brand name is not covered by insurance. Pharmacy is [facility] [place]. Patient phone number is [phone].

## 2022-09-21 NOTE — Telephone Encounter (Signed)
Patient is returning phone call. Patient phone number is [phone].

## 2022-09-21 NOTE — Telephone Encounter (Signed)
Spoke with pt who states that she needs generic Symbicort sent into the [facility] in [place]. Dr. Judeth Horn are you ok with [place] sending this order in?

## 2022-09-21 NOTE — Telephone Encounter (Signed)
Called patient but she did not answer. Left message for patient to call back.  

## 2022-09-24 ENCOUNTER — Telehealth: Payer: Self-pay

## 2022-09-24 ENCOUNTER — Other Ambulatory Visit: Payer: Self-pay

## 2022-09-24 ENCOUNTER — Other Ambulatory Visit (HOSPITAL_COMMUNITY): Payer: Self-pay

## 2022-09-24 MED ORDER — BUDESONIDE-FORMOTEROL FUMARATE 160-4.5 MCG/ACT IN AERO: 2.0000 | INHALATION_SPRAY | Freq: Two times a day (BID) | RESPIRATORY_TRACT | 6 refills | Status: AC

## 2022-09-24 NOTE — Telephone Encounter (Signed)
PA request received via CMM for Budesonide-Formoterol Fumarate 160-4.5MCG/ACT aerosol  PA submitted to [facility] and is pending additional questions/determination  Key: [employer]

## 2022-09-24 NOTE — Telephone Encounter (Signed)
Spoke with patient. Advised generic symbicort has been sent to pharmacy. NFN

## 2022-09-24 NOTE — Telephone Encounter (Signed)
Yes - ok with me - thanks! 

## 2022-09-28 NOTE — Telephone Encounter (Signed)
We reviewed the information you provided in support of a request to obtain Budesonideformoterol 160-4.43mcg HFA AER AD under your patient's plan. We are unable to approve this request for the following reason(s): ? Coverage is provided for asthma or reactive airway disease, chronic obstructive pulmonary disease, emphysema, chronic bronchitis, and postinfectious cough. Please Note: Postinfectious cough is cough that persists after an acute respiratory infection has resolved. Coverage cannot be authorized at this time.

## 2022-10-04 NOTE — Telephone Encounter (Signed)
Patient would like the nurse to call to explain further her denial for the medication.  Please call patient at [phone]

## 2022-10-04 NOTE — Telephone Encounter (Signed)
My note states that I think she has cough variant asthma - I do not understand the refusal or denial as I state asthma. I do not see any discussion of post viral cough in my assessment and plan. Can we re-submit?

## 2022-10-04 NOTE — Telephone Encounter (Signed)
Dr. Judeth Horn, please see reason for denial of Symbicort inhaler and advise on this what you want to do.

## 2022-10-05 ENCOUNTER — Telehealth: Payer: Self-pay | Admitting: Pulmonary Disease

## 2022-10-05 NOTE — Telephone Encounter (Signed)
Patient states generic Symbicort needs diagnosis code or asthma. Patient phone number is [phone].

## 2022-10-05 NOTE — Telephone Encounter (Signed)
Called and spoke with pt  letting her know that we are working with our prior auth team trying to get the generic Symbicort inhaler approved and she verbalized understanding. Nothing further needed. Please refer to prior auth encounter.

## 2022-10-12 NOTE — Telephone Encounter (Signed)
Chart states chronic cough, PA was submitted for Chronic Cough. If appeal is needed:

## 2022-10-17 NOTE — Telephone Encounter (Signed)
Brett Canales husband states Symbicort can be sent to clinical appeal. Phone number is [phone]. Brett Canales phone number is [phone].

## 2023-05-27 IMAGING — MG MM DIGITAL SCREENING BILAT W/ TOMO AND CAD
8 series · 9 of 24 positions shown · non-contrast
Comparison: Previous exam(s).

CLINICAL DATA: Screening.

EXAM:
DIGITAL SCREENING BILATERAL MAMMOGRAM WITH TOMOSYNTHESIS AND CAD
TECHNIQUE: Bilateral screening digital craniocaudal and mediolateral oblique
mammograms were obtained. Bilateral screening digital breast
tomosynthesis was performed. The images were evaluated with
computer-aided detection.

[R CC synth-2D]
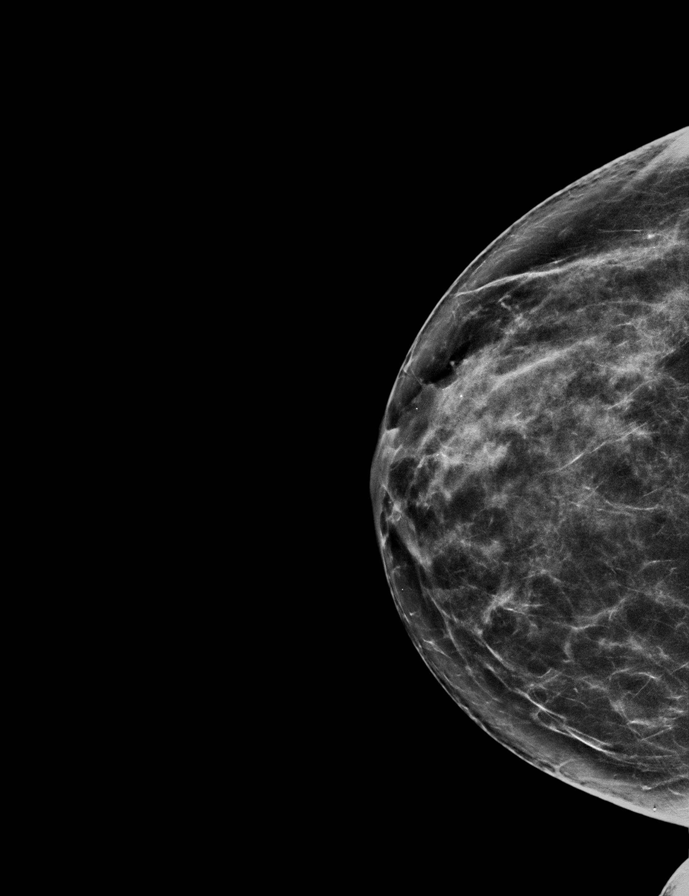

[L CC synth-2D]
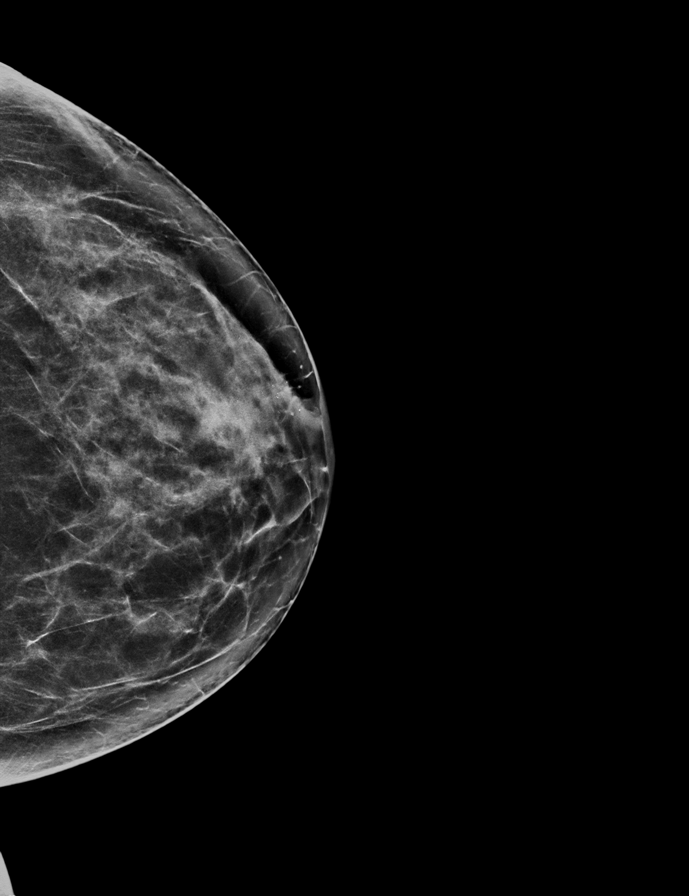

[L MLO synth-2D]
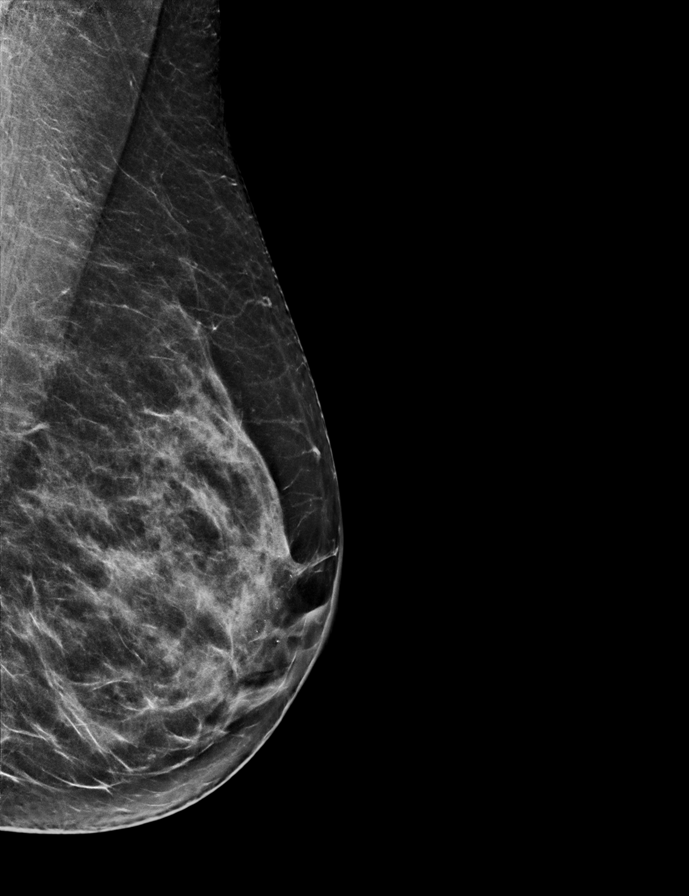

[R MLO synth-2D]
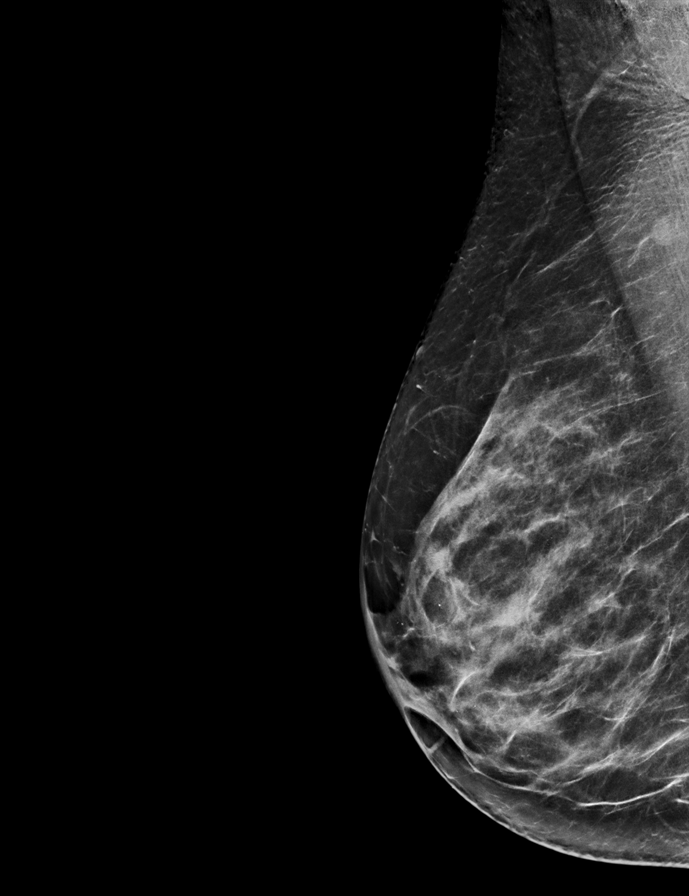

[R MLO tomo · 2 of 72 frames shown]
[frame 24/72]
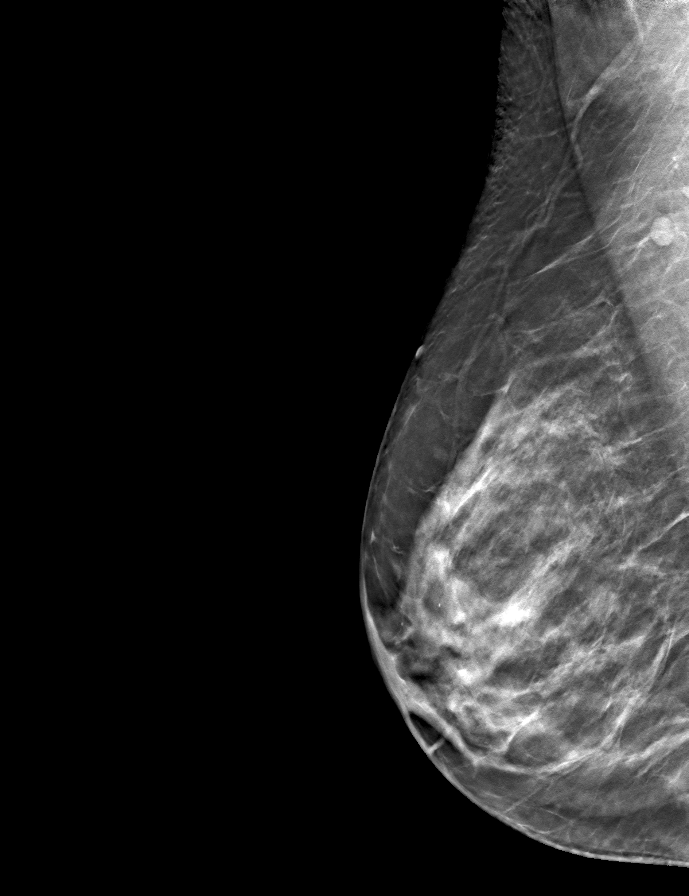
[frame 37/72]
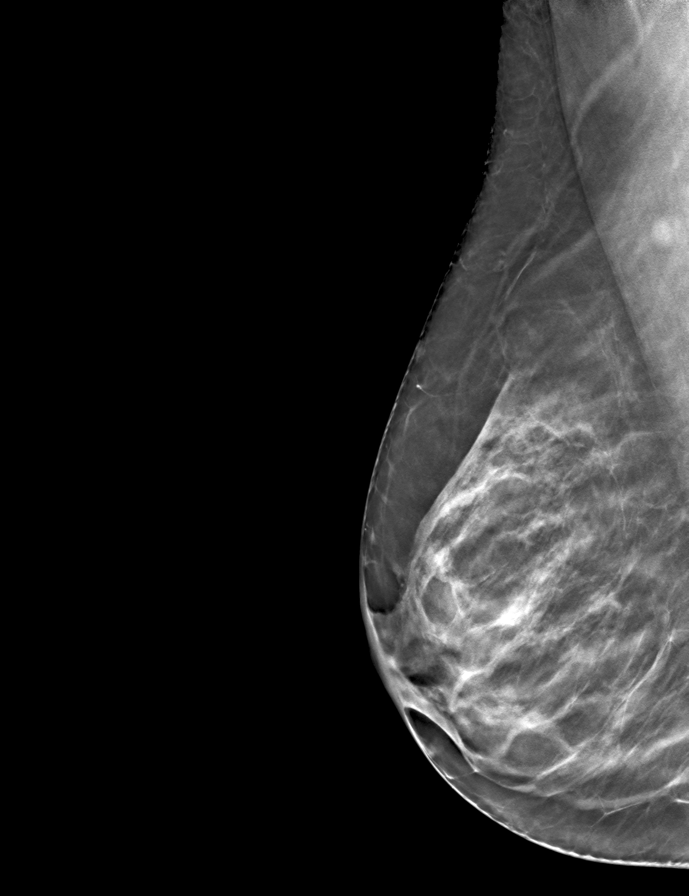

[L CC tomo · tomo slice 37/72.0]
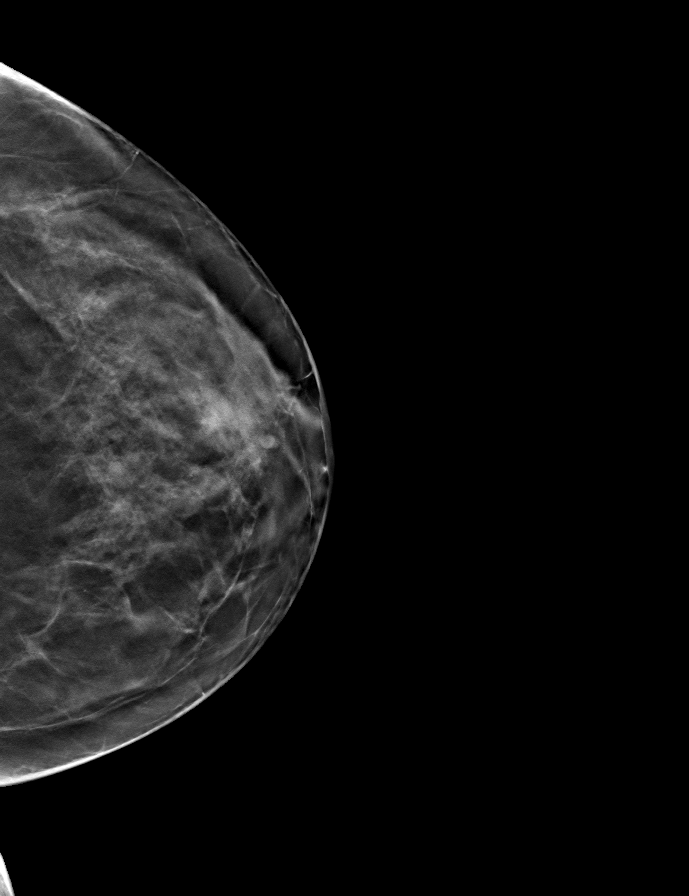

[L MLO tomo · tomo slice 35/69.0]
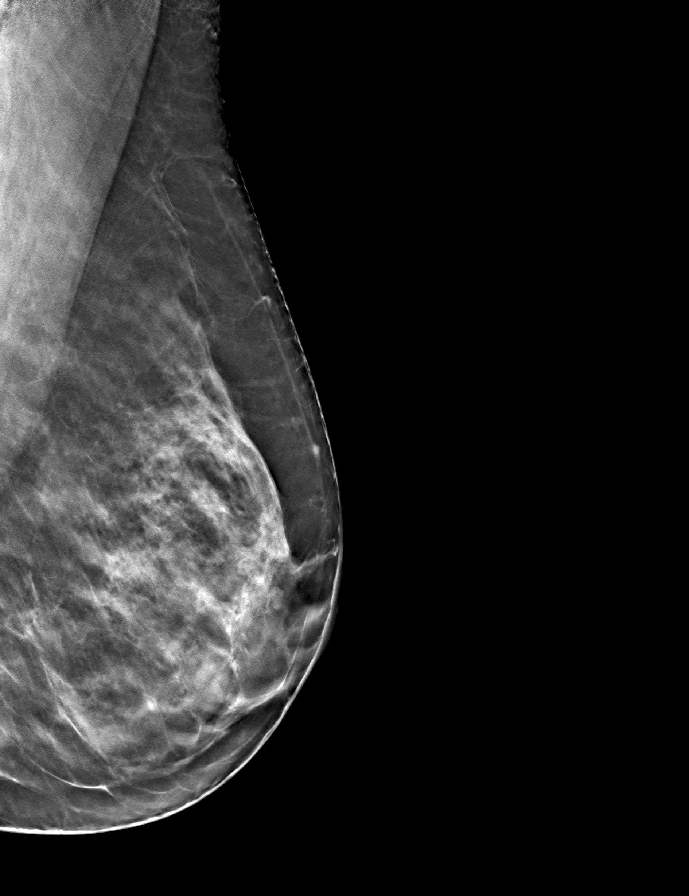

[R CC tomo · tomo slice 37/72.0]
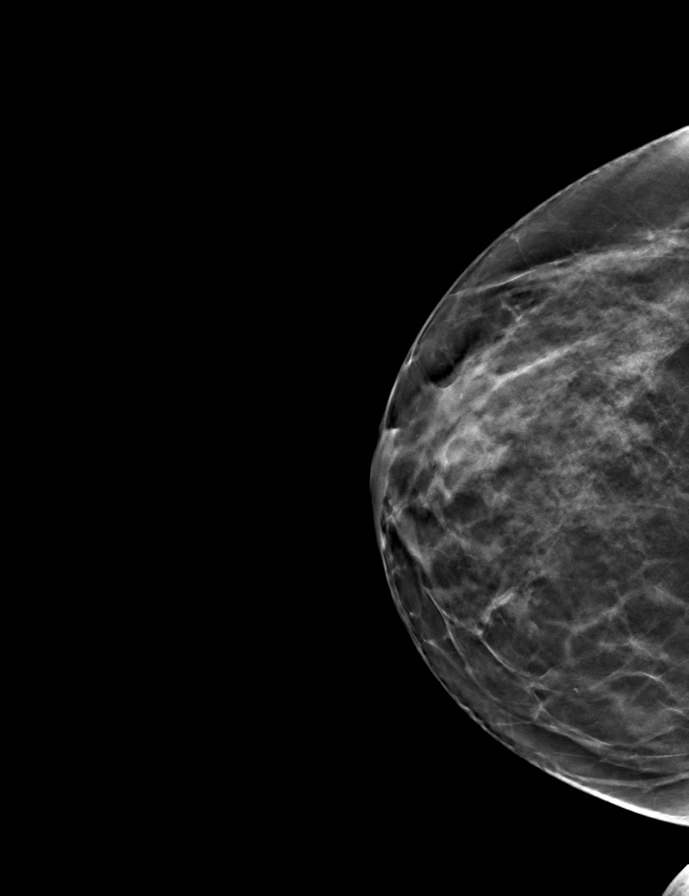

[9 of 24 positions shown; findings below may reference images not displayed]

ACR Breast Density Category c: The breast tissue is heterogeneously
dense, which may obscure small masses.
FINDINGS: There are no findings suspicious for malignancy.
IMPRESSION: No mammographic evidence of malignancy. A result letter of this
screening mammogram will be mailed directly to the patient.

RECOMMENDATION:
Screening mammogram in one year. (Code:Q3-W-BC3)

BI-RADS CATEGORY  1: Negative.

## 2023-08-05 ENCOUNTER — Other Ambulatory Visit: Payer: Self-pay | Admitting: Certified Nurse Midwife

## 2023-08-05 DIAGNOSIS — Z1231 Encounter for screening mammogram for malignant neoplasm of breast: Secondary | ICD-10-CM

## 2023-09-06 NOTE — Progress Notes (Signed)
 Office Visit Note  Patient: Alison Patel             Date of Birth: 03-17-1966           MRN: [id number]             PCP: Shayne Demark, MD Referring: No ref. provider found Visit Date: 09/20/2023 Occupation: @GUAROCC @  Subjective:  Positive RNP and cough  History of Present Illness: Alison Patel is a 58 y.o. female with positive RNP, chronic cough-.  She returns today after her last visit in May 2024.  She states she has not had much problems with hives and rash since she has been taking antihistamines.  She feels some dryness in her mouth which she relates to medication use.  She states she continues to have some chronic cough which was diagnosed as asthma by her pulmonologist.  She takes inhalers on as needed basis.  She said inhalers caused tachycardia so she was taking inhalers.  She denies any history of oral ulcers, nasal ulcers, malar rash, for sensitivity, Raynaud's or lymphadenopathy.  There is no history of inflammatory arthritis.    Activities of Daily Living:  Patient reports morning stiffness for 0 minutes.   Patient Denies nocturnal pain.  Difficulty dressing/grooming: Denies Difficulty climbing stairs: Denies Difficulty getting out of chair: Denies Difficulty using hands for taps, buttons, cutlery, and/or writing: Denies  Review of Systems  Constitutional: Negative.  Negative for fatigue.  HENT:  Positive for mouth dryness. Negative for mouth sores.   Eyes: Negative.  Negative for dryness.  Respiratory: Negative.  Negative for shortness of breath.   Cardiovascular: Negative.  Negative for chest pain and palpitations.  Gastrointestinal: Negative.  Negative for blood in stool, constipation and diarrhea.  Endocrine: Negative.  Negative for increased urination.  Genitourinary: Negative.  Negative for involuntary urination.  Musculoskeletal: Negative.  Negative for joint pain, gait problem, joint pain, joint swelling, myalgias, muscle weakness, morning  stiffness, muscle tenderness and myalgias.  Skin: Negative.  Negative for color change, rash, hair loss and sensitivity to sunlight.  Allergic/Immunologic: Negative.  Negative for susceptible to infections.  Neurological: Negative.  Negative for dizziness and headaches.  Hematological: Negative.  Negative for swollen glands.  Psychiatric/Behavioral:  Negative for depressed mood and sleep disturbance. The patient is nervous/anxious.     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 No pertinent past medical history.  Family History  Problem Relation Age of Onset   Breast cancer Mother 74   Diabetes Father    Prostate cancer Father    Healthy Sister    Healthy Brother    Breast cancer Maternal Aunt 64   Colon cancer Maternal Uncle    Healthy Son    Healthy Daughter    Past Surgical History:  Procedure Laterality Date   DILATION AND CURETTAGE OF UTERUS  1996   NO PAST SURGERIES     Social History   Social History Narrative   Not on file   Immunization History  Administered Date(s) Administered   Tdap 01/04/2017, 11/11/2021     Objective: Vital Signs: BP 123/75 (BP Location: Left Arm, Patient Position: Sitting)   Pulse 80   Resp 16   Ht 5' 6.5" (1.689 m)   Wt 130 lb (59 kg)   SpO2  98%   BMI 20.67 kg/m    Physical Exam Vitals and nursing note reviewed.  Constitutional:      Appearance: She is well-developed.  HENT:     Head: Normocephalic and atraumatic.  Eyes:     Conjunctiva/sclera: Conjunctivae normal.  Cardiovascular:     Rate and Rhythm: Normal rate and regular rhythm.     Heart sounds: Normal heart sounds.  Pulmonary:     Effort: Pulmonary effort is normal.     Breath sounds: Normal breath sounds.  Abdominal:     General: Bowel sounds are normal.      Palpations: Abdomen is soft.  Musculoskeletal:     Cervical back: Normal range of motion.  Lymphadenopathy:     Cervical: No cervical adenopathy.  Skin:    General: Skin is warm and dry.     Capillary Refill: Capillary refill takes less than 2 seconds.  Neurological:     Mental Status: She is alert and oriented to person, place, and time.  Psychiatric:        Behavior: Behavior normal.      Musculoskeletal Exam: Cervical, thoracic and lumbar spine were in good range of motion.  Shoulders, elbows, wrist joints, MCPs PIPs and DIPs with good range of motion.  She had bilateral DIP thickening with no synovitis.  Hip joints and knee joints in good range of motion.  There was no tenderness over the ankles or MTPs.  CDAI Exam: CDAI Score: -- Patient Global: --; Provider Global: -- Swollen: --; Tender: -- Joint Exam 09/20/2023   No joint exam has been documented for this visit   There is currently no information documented on the homunculus. Go to the Rheumatology activity and complete the homunculus joint exam.  Investigation: No additional findings.  Imaging: No results found.  Recent Labs: No results found for: "WBC", "HGB", "PLT", "NA", "K", "CL", "CO2", "GLUCOSE", "BUN", "CREATININE", "BILITOT", "ALKPHOS", "AST", "ALT", "PROT", "ALBUMIN", "CALCIUM", "GFRAA", "QFTBGOLD", "QFTBGOLDPLUS"  Speciality Comments: No specialty comments available.  Procedures:  No procedures performed Allergies: Penicillins   Assessment / Plan:     Visit Diagnoses: Positive RNP antibody-patient has positive RNP.  She denies any history of oral ulcers, nasal ulcers, malar rash, photosensitivity, Raynaud's, inflammatory arthritis or lymphadenopathy.  She continues to have some cough but denies any shortness of breath.  She was evaluated by pulmonologist and was diagnosed with asthma.  No nailbed capillary changes, sclerodactyly was noted.  She had good capillary refill.  I will recheck labs today.   Advised patient to contact us  if she develops any new symptoms.  Will call her with the lab results.  Urticaria-states that she has not had recent problems with urticaria.  She has been taking antihistamines which are helpful.  Chronic cough - she has seen ENT, pulmonologist and allergist in the past.  She was evaluated by Dr. Marygrace Snellen who diagnosed her with asthma.  Patient avoids inhalers due to tachycardia..  Mixed hyperlipidemia  Gilbert's syndrome  Hx of adenomatous colonic polyps  Anxiety  Orders: Orders Placed This Encounter  Procedures   Protein / creatinine ratio, urine   CBC with Differential/Platelet   Comprehensive metabolic panel with GFR   ANA   Anti-DNA antibody, double-stranded   C3 and C4   Sedimentation rate   RNP Antibody   No orders of the defined types were placed in this encounter.    Follow-Up Instructions: Return in about 1 year (around 09/19/2024) for +RNP.   Nicholas Bari, MD  Note -  This record has been created using [employer].  Chart creation errors have been sought, but may not always  have been located. Such creation errors do not reflect on  the standard of medical care.

## 2023-09-19 ENCOUNTER — Ambulatory Visit
Admission: RE | Admit: 2023-09-19 | Discharge: 2023-09-19 | Disposition: A | Discharge status: Home / Self Care | Source: Ambulatory Visit | Attending: Certified Nurse Midwife | Admitting: Certified Nurse Midwife

## 2023-09-19 DIAGNOSIS — Z1231 Encounter for screening mammogram for malignant neoplasm of breast: Secondary | ICD-10-CM | POA: Diagnosis present

## 2023-09-20 ENCOUNTER — Ambulatory Visit: Payer: 59 | Attending: Rheumatology | Admitting: Rheumatology

## 2023-09-20 ENCOUNTER — Encounter: Payer: Self-pay | Admitting: Rheumatology

## 2023-09-20 VITALS — BP 123/75 | HR 80 | Resp 16 | Ht 66.5 in | Wt 130.0 lb

## 2023-09-20 DIAGNOSIS — R768 Other specified abnormal immunological findings in serum: Secondary | ICD-10-CM

## 2023-09-20 DIAGNOSIS — Z860101 Personal history of adenomatous and serrated colon polyps: Secondary | ICD-10-CM

## 2023-09-20 DIAGNOSIS — L509 Urticaria, unspecified: Secondary | ICD-10-CM | POA: Diagnosis not present

## 2023-09-20 DIAGNOSIS — E782 Mixed hyperlipidemia: Secondary | ICD-10-CM

## 2023-09-20 DIAGNOSIS — F419 Anxiety disorder, unspecified: Secondary | ICD-10-CM

## 2023-09-20 DIAGNOSIS — R053 Chronic cough: Secondary | ICD-10-CM

## 2023-09-23 LAB — COMPREHENSIVE METABOLIC PANEL WITH GFR
AG Ratio: 1.7 (calc) (ref 1.0–2.5)
ALT: 8 U/L (ref 6–29)
AST: 16 U/L (ref 10–35)
Albumin: 4.6 g/dL (ref 3.6–5.1)
Alkaline phosphatase (APISO): 58 U/L (ref 37–153)
BUN: 10 mg/dL (ref 7–25)
CO2: 31 mmol/L (ref 20–32)
Calcium: 9.9 mg/dL (ref 8.6–10.4)
Chloride: 98 mmol/L (ref 98–110)
Creat: 0.62 mg/dL (ref 0.50–1.03)
Globulin: 2.7 g/dL (ref 1.9–3.7)
Glucose, Bld: 82 mg/dL (ref 65–99)
Potassium: 4.5 mmol/L (ref 3.5–5.3)
Sodium: 135 mmol/L (ref 135–146)
Total Bilirubin: 0.9 mg/dL (ref 0.2–1.2)
Total Protein: 7.3 g/dL (ref 6.1–8.1)
eGFR: 103 mL/min/{1.73_m2} (ref >=60)

## 2023-09-23 LAB — ANTI-NUCLEAR AB-TITER (ANA TITER): ANA Titer 1: 1:40 {titer} — ABNORMAL HIGH

## 2023-09-23 LAB — CBC WITH DIFFERENTIAL/PLATELET
Absolute Lymphocytes: 2092 {cells}/uL (ref 850–3900)
Absolute Monocytes: 279 {cells}/uL (ref 200–950)
Basophils Absolute: 49 {cells}/uL (ref 0–200)
Basophils Relative: 1 %
Eosinophils Absolute: 59 {cells}/uL (ref 15–500)
Eosinophils Relative: 1.2 %
HCT: 40.7 % (ref 35.0–45.0)
Hemoglobin: 13.1 g/dL (ref 11.7–15.5)
MCH: 28.9 pg (ref 27.0–33.0)
MCHC: 32.2 g/dL (ref 32.0–36.0)
MCV: 89.8 fL (ref 80.0–100.0)
MPV: 10.3 fL (ref 7.5–12.5)
Monocytes Relative: 5.7 %
Neutro Abs: 2421 {cells}/uL (ref 1500–7800)
Neutrophils Relative %: 49.4 %
Platelets: 334 10*3/uL (ref 140–400)
RBC: 4.53 10*6/uL (ref 3.80–5.10)
RDW: 12.6 % (ref 11.0–15.0)
Total Lymphocyte: 42.7 %
WBC: 4.9 10*3/uL (ref 3.8–10.8)

## 2023-09-23 LAB — RNP ANTIBODY: Ribonucleic Protein(ENA) Antibody, IgG: 2.2 AI — AB

## 2023-09-23 LAB — ANA: Anti Nuclear Antibody (ANA): POSITIVE — AB

## 2023-09-23 LAB — ANTI-DNA ANTIBODY, DOUBLE-STRANDED: ds DNA Ab: 1 [IU]/mL

## 2023-09-23 LAB — C3 AND C4
C3 Complement: 104 mg/dL (ref 83–193)
C4 Complement: 22 mg/dL (ref 15–57)

## 2023-09-23 LAB — SEDIMENTATION RATE: Sed Rate: 11 mm/h (ref 0–30)

## 2023-09-23 LAB — PROTEIN / CREATININE RATIO, URINE
Creatinine, Urine: 49 mg/dL (ref 20–275)
Protein/Creat Ratio: 102 mg/g{creat} (ref 24–184)
Protein/Creatinine Ratio: 0.102 mg/mg{creat} (ref 0.024–0.184)
Total Protein, Urine: 5 mg/dL (ref 5–24)

## 2023-09-23 NOTE — Progress Notes (Signed)
 ANA low titer positive, dsDNA negative, RNP positive, CBC CMP normal, urine protein creatinine ratio normal, sed rate normal, complements pending.  No change in treatment advised.

## 2023-09-24 ENCOUNTER — Ambulatory Visit: Payer: Self-pay | Admitting: Rheumatology

## 2023-09-24 NOTE — Progress Notes (Signed)
Complements normal

## 2024-09-18 ENCOUNTER — Ambulatory Visit: Admitting: Rheumatology
